# Patient Record
Sex: Female | Born: 1980 | Race: Asian | Hispanic: No | Marital: Single | Smoking: Never smoker
Health system: Southern US, Community
[De-identification: ages and names within clinical notes are randomized; demographics above are authoritative.]

## PROBLEM LIST (undated history)

## (undated) HISTORY — PX: ANTERIOR COMPARTMENT DECOMPRESSION: SHX547

## (undated) HISTORY — PX: ANTERIOR CRUCIATE LIGAMENT REPAIR: SHX115

---

## 2001-08-29 ENCOUNTER — Emergency Department (HOSPITAL_COMMUNITY): Admission: EM | Admit: 2001-08-29 | Discharge: 2001-08-29 | Payer: Self-pay | Admitting: Emergency Medicine

## 2010-09-03 ENCOUNTER — Emergency Department (HOSPITAL_BASED_OUTPATIENT_CLINIC_OR_DEPARTMENT_OTHER)
Admission: EM | Admit: 2010-09-03 | Discharge: 2010-09-03 | Disposition: A | Discharge status: Home / Self Care | Payer: BC Managed Care – PPO | Attending: Emergency Medicine | Admitting: Emergency Medicine

## 2010-09-03 ENCOUNTER — Emergency Department (INDEPENDENT_AMBULATORY_CARE_PROVIDER_SITE_OTHER): Payer: BC Managed Care – PPO

## 2010-09-03 DIAGNOSIS — R209 Unspecified disturbances of skin sensation: Secondary | ICD-10-CM

## 2010-09-03 DIAGNOSIS — R55 Syncope and collapse: Secondary | ICD-10-CM | POA: Insufficient documentation

## 2010-09-03 LAB — URINALYSIS, ROUTINE W REFLEX MICROSCOPIC
Glucose, UA: NEGATIVE mg/dL
Ketones, ur: NEGATIVE mg/dL
Specific Gravity, Urine: 1.004 — ABNORMAL LOW (ref 1.005–1.030)
pH: 6.5 (ref 5.0–8.0)

## 2010-09-03 LAB — DIFFERENTIAL
Basophils Relative: 0 % (ref 0–1)
Lymphs Abs: 1.4 10*3/uL (ref 0.7–4.0)
Monocytes Relative: 8 % (ref 3–12)
Neutro Abs: 4.1 10*3/uL (ref 1.7–7.7)
Neutrophils Relative %: 67 % (ref 43–77)

## 2010-09-03 LAB — CBC
Hemoglobin: 12.6 g/dL (ref 12.0–15.0)
MCH: 26.3 pg (ref 26.0–34.0)
MCV: 77.9 fL — ABNORMAL LOW (ref 78.0–100.0)
RBC: 4.79 MIL/uL (ref 3.87–5.11)

## 2010-09-03 LAB — COMPREHENSIVE METABOLIC PANEL
ALT: <6 U/L (ref 0–35)
AST: 21 U/L (ref 0–37)
Albumin: 4.5 g/dL (ref 3.5–5.2)
Alkaline Phosphatase: 64 U/L (ref 39–117)
GFR calc Af Amer: >60 mL/min (ref >60)
Glucose, Bld: 88 mg/dL (ref 70–99)
Potassium: 3.8 mEq/L (ref 3.5–5.1)
Sodium: 145 mEq/L (ref 135–145)
Total Protein: 8.1 g/dL (ref 6.0–8.3)

## 2011-07-18 ENCOUNTER — Encounter (HOSPITAL_BASED_OUTPATIENT_CLINIC_OR_DEPARTMENT_OTHER): Payer: Self-pay

## 2011-07-18 ENCOUNTER — Emergency Department (HOSPITAL_BASED_OUTPATIENT_CLINIC_OR_DEPARTMENT_OTHER)
Admission: EM | Admit: 2011-07-18 | Discharge: 2011-07-18 | Disposition: A | Discharge status: Home / Self Care | Payer: BC Managed Care – PPO | Attending: Emergency Medicine | Admitting: Emergency Medicine

## 2011-07-18 DIAGNOSIS — R55 Syncope and collapse: Secondary | ICD-10-CM | POA: Insufficient documentation

## 2011-07-18 DIAGNOSIS — R42 Dizziness and giddiness: Secondary | ICD-10-CM | POA: Insufficient documentation

## 2011-07-18 LAB — CBC
MCH: 26.9 pg (ref 26.0–34.0)
MCHC: 33.8 g/dL (ref 30.0–36.0)
MCV: 79.7 fL (ref 78.0–100.0)
Platelets: 206 10*3/uL (ref 150–400)
RDW: 12.4 % (ref 11.5–15.5)

## 2011-07-18 LAB — PREGNANCY, URINE: Preg Test, Ur: NEGATIVE

## 2011-07-18 LAB — BASIC METABOLIC PANEL
BUN: 13 mg/dL (ref 6–23)
Calcium: 8.8 mg/dL (ref 8.4–10.5)
Creatinine, Ser: 1 mg/dL (ref 0.50–1.10)
GFR calc non Af Amer: 75 mL/min — ABNORMAL LOW (ref >90)
Glucose, Bld: 124 mg/dL — ABNORMAL HIGH (ref 70–99)
Sodium: 136 mEq/L (ref 135–145)

## 2011-07-18 NOTE — Discharge Instructions (Signed)
Rest. Drink plenty of fluids. Follow up with primary care doctor in the next few days. Return to ER if worse, faint, persistent vomiting, high fevers, trouble breathing, other concern.     Near-Syncope Near-syncope is sudden weakness, dizziness, or feeling like you might pass out (faint). This may occur when getting up after sitting or while standing for a long period of time. Near-syncope can be caused by a drop in blood pressure. This is a common reaction, but it may occur to a greater degree in people taking medicines to control their blood pressure. Fainting often occurs when the blood pressure or pulse is too low to provide enough blood flow to the brain to keep you conscious. Fainting and near-syncope are not usually due to serious medical problems. However, certain people should be more cautious in the event of near-syncope, including elderly patients, patients with diabetes, and patients with a history of heart conditions (especially irregular rhythms).  CAUSES   Drop in blood pressure.   Physical pain.   Dehydration.   Heat exhaustion.   Emotional distress.   Low blood sugar.   Internal bleeding.   Heart and circulatory problems.   Infections.  SYMPTOMS   Dizziness.   Feeling sick to your stomach (nauseous).   Nearly fainting.   Body numbness.   Turning pale.   Tunnel vision.   Weakness.  HOME CARE INSTRUCTIONS   Lie down right away if you start feeling like you might faint. Breathe deeply and steadily. Wait until all the symptoms have passed. Most of these episodes last only a few minutes. You may feel tired for several hours.   Drink enough fluids to keep your urine clear or pale yellow.   If you are taking blood pressure or heart medicine, get up slowly, taking several minutes to sit and then stand. This can reduce dizziness that is caused by a drop in blood pressure.  SEEK IMMEDIATE MEDICAL CARE IF:   You have a severe headache.   Unusual pain develops  in the chest, abdomen, or back.   There is bleeding from the mouth or rectum, or you have black or tarry stool.   An irregular heartbeat or a very rapid pulse develops.   You have repeated fainting or seizure-like jerking during an episode.   You faint when sitting or lying down.   You develop confusion.   You have difficulty walking.   Severe weakness develops.   Vision problems develop.  MAKE SURE YOU:   Understand these instructions.   Will watch your condition.   Will get help right away if you are not doing well or get worse.  Document Released: 05/04/2005 Document Revised: 01/14/2011 Document Reviewed: 06/20/2010 [facility] Patient Information 2012 [place], [place].    Syncope You have had a fainting (syncopal) spell. A fainting episode is a sudden, short-lived loss of consciousness. It results in complete recovery. It occurs because there has been a temporary shortage of oxygen and/or sugar (glucose) to the brain. CAUSES   Blood pressure pills and other medications that may lower blood pressure below normal. Sudden changes in posture (sudden standing).   Over-medication. Take your medications as directed.   Standing too long. This can cause blood to pool in the legs.   Seizure disorders.   Low blood sugar (hypoglycemia) of diabetes. This more commonly causes coma.   Bearing down to go to the bathroom. This can cause your blood pressure to rise suddenly. Your body compensates by making the blood pressure too  low when you stop bearing down.   Hardening of the arteries where the brain temporarily does not receive enough blood.   Irregular heart beat and circulatory problems.   Fear, emotional distress, injury, sight of blood, or illness.  Your caregiver will send you home if the syncope was from non-worrisome causes (benign). Depending on your age and health, you may stay to be monitored and observed. If you return home, have someone stay with you if your caregiver  feels that is desirable. It is very important to keep all follow-up referrals and appointments in order to properly manage this condition. This is a serious problem which can lead to serious illness and death if not carefully managed.  WARNING: Do not drive or operate machinery until your caregiver feels that it is safe for you to do so. SEEK IMMEDIATE MEDICAL CARE IF:   You have another fainting episode or faint while lying or sitting down. DO NOT DRIVE YOURSELF. Call 911 if no other help is available.   You have chest pain, are feeling sick to your stomach (nausea), vomiting or abdominal pain.   You have an irregular heartbeat or one that is very fast (pulse over 120 beats per minute).   You have a loss of feeling in some part of your body or lose movement in your arms or legs.   You have difficulty with speech, confusion, severe weakness, or visual problems.   You become sweaty and/or feel light headed.  Make sure you are rechecked as instructed. Document Released: 05/04/2005 Document Revised: 01/14/2011 Document Reviewed: 12/23/2006 [facility] Patient Information 2012 [place], [place].

## 2011-07-18 NOTE — ED Notes (Signed)
Pt sts,felt faint and passed out per family member about 30sec. Not eating but going to BR today ok.

## 2011-07-18 NOTE — ED Provider Notes (Signed)
History     CSN: [id number]  Arrival date & time 07/18/11  4098   First MD Initiated Contact with Patient 07/18/11 [phone]      Chief Complaint  Patient presents with  . Loss of Consciousness    (Consider location/radiation/quality/duration/timing/severity/associated sxs/prior treatment) Patient is a 31 y.o. female presenting with syncope. The history is provided by the patient.  Loss of Consciousness Pertinent negatives include no chest pain, no abdominal pain, no headaches and no shortness of breath.  pt presents from home after ?near syncopal vs transiently syncopal episode. Pt states recent rx w abx for cough, states cough is better, but says in past 1-2 days the medication occasionally makes nauseated, as a result, poor po intake for past couple days. Tonight got up to go to bathroom, felt lightheaded/sl nauseated, got warm/flushed feeling, stood up, felt faint, leaned against bed/dresser, ?transient loc. No seizure activity or postictal period. Denies palpitations or irregular heartbeat. No cp or sob. Says remote hx similar symptoms, no hx dysrhythmia. No blood loss, heavy vaginal bleeding or any rectal bleeding. No fever or chills. Denies pain. No headache. No neck pain or stiffness. No abd pain. No gu c/o.   History reviewed. No pertinent past medical history.  Past Surgical History  Procedure Date  . Anterior cruciate ligament repair   . Anterior compartment decompression     No family history on file.  History  Substance Use Topics  . Smoking status: Not on file  . Smokeless tobacco: Not on file  . Alcohol Use: No    OB History    Grav Para Term Preterm Abortions TAB SAB Ect Mult Living                  Review of Systems  Constitutional: Negative for fever and chills.  HENT: Negative for neck pain.   Eyes: Negative for visual disturbance.  Respiratory: Negative for shortness of breath.   Cardiovascular: Positive for syncope. Negative for chest pain and  palpitations.  Gastrointestinal: Negative for abdominal pain and diarrhea.  Genitourinary: Negative for dysuria and flank pain.  Musculoskeletal: Negative for back pain.  Skin: Negative for rash.  Neurological: Negative for weakness, numbness and headaches.  Hematological: Does not bruise/bleed easily.  Psychiatric/Behavioral: Negative for confusion.    Allergies  Review of patient's allergies indicates no known allergies.  Home Medications   Current Outpatient Rx  Name Route Sig Dispense Refill  . BENZONATATE 200 MG PO CAPS Oral Take 200 mg by mouth 3 (three) times daily as needed.    . SULFAMETHOXAZOLE-TRIMETHOPRIM 200-40 MG/5ML PO SUSP Oral Take by mouth 2 (two) times daily.      BP 110/72  Pulse 86  Temp(Src) 99.6 F (37.6 C) (Oral)  Resp 16  Ht 5\' 1"  (1.549 m)  Wt 158 lb (71.668 kg)  BMI 29.85 kg/m2  SpO2 99%  LMP 07/18/2011  Physical Exam  Nursing note and vitals reviewed. Constitutional: She is oriented to person, place, and time. She appears well-developed and well-nourished. No distress.  HENT:  Head: Atraumatic.  Mouth/Throat: Oropharynx is clear and moist.       tms wnl  Eyes: Conjunctivae are normal. Pupils are equal, round, and reactive to light. No scleral icterus.  Neck: Normal range of motion. Neck supple. No tracheal deviation present.  Cardiovascular: Normal rate, regular rhythm, normal heart sounds and intact distal pulses.  Exam reveals no gallop and no friction rub.   No murmur heard. Pulmonary/Chest: Effort normal and breath sounds  normal. No respiratory distress.  Abdominal: Soft. Normal appearance and bowel sounds are normal. She exhibits no distension and no mass. There is no tenderness. There is no rebound and no guarding.  Genitourinary:       No cva tenderness  Musculoskeletal: She exhibits no edema.  Neurological: She is alert and oriented to person, place, and time.       Motor intact bil, steady gait.   Skin: Skin is warm and dry. No  rash noted.  Psychiatric:       Anxious/shaky.     ED Course  Procedures (including critical care time)   Labs Reviewed  BASIC METABOLIC PANEL  CBC  PREGNANCY, URINE   Results for orders placed during the hospital encounter of 07/18/11  BASIC METABOLIC PANEL      Component Value Range   Sodium 136  135 - 145 (mEq/L)   Potassium 3.7  3.5 - 5.1 (mEq/L)   Chloride 99  96 - 112 (mEq/L)   CO2 25  19 - 32 (mEq/L)   Glucose, Bld 124 (*) 70 - 99 (mg/dL)   BUN 13  6 - 23 (mg/dL)   Creatinine, Ser 1.61  0.50 - 1.10 (mg/dL)   Calcium 8.8  8.4 - 09.6 (mg/dL)   GFR calc non Af Amer 75 (*) >90 (mL/min)   GFR calc Af Amer 87 (*) >90 (mL/min)  CBC      Component Value Range   WBC 3.2 (*) 4.0 - 10.5 (K/uL)   RBC 4.72  3.87 - 5.11 (MIL/uL)   Hemoglobin 12.7  12.0 - 15.0 (g/dL)   HCT 04.5  40.9 - 81.1 (%)   MCV 79.7  78.0 - 100.0 (fL)   MCH 26.9  26.0 - 34.0 (pg)   MCHC 33.8  30.0 - 36.0 (g/dL)   RDW 91.4  78.2 - 95.6 (%)   Platelets 206  150 - 400 (K/uL)  PREGNANCY, URINE      Component Value Range   Preg Test, Ur NEGATIVE  NEGATIVE       MDM  Po fluids. Labs.   hgb at baseline, u preg neg. Taking po. No faintness or dizziness. ?mild vol dep, ?vagal episode w lightheadedness post using bathroom. Pt stable for d/c home.       Suzi Roots, MD 07/18/11 [phone]

## 2012-06-25 IMAGING — CR DG CHEST 2V
2 series · 2 of 2 positions shown · non-contrast
Comparison: None

CLINICAL DATA: Numbness and tingling.  Racing heart

CHEST - 2 VIEW

[w chest pa]
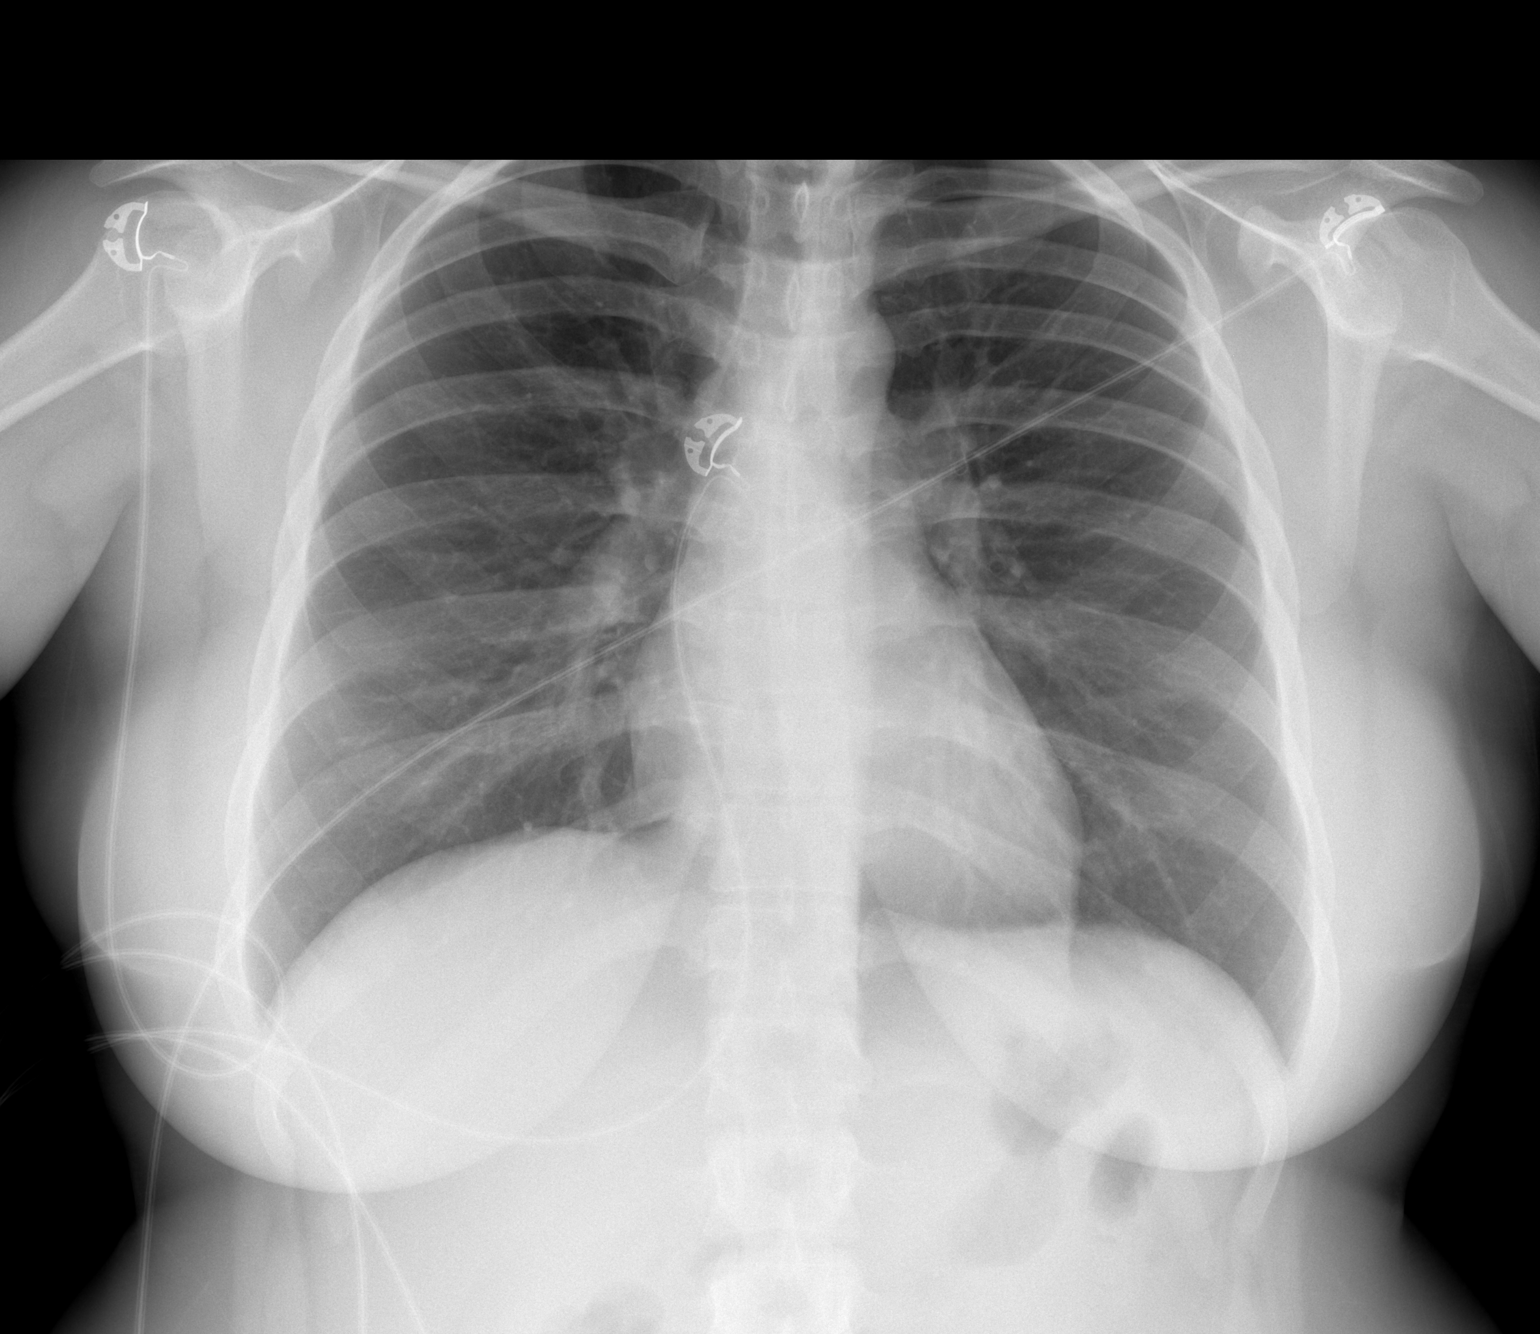

[w chest lat]
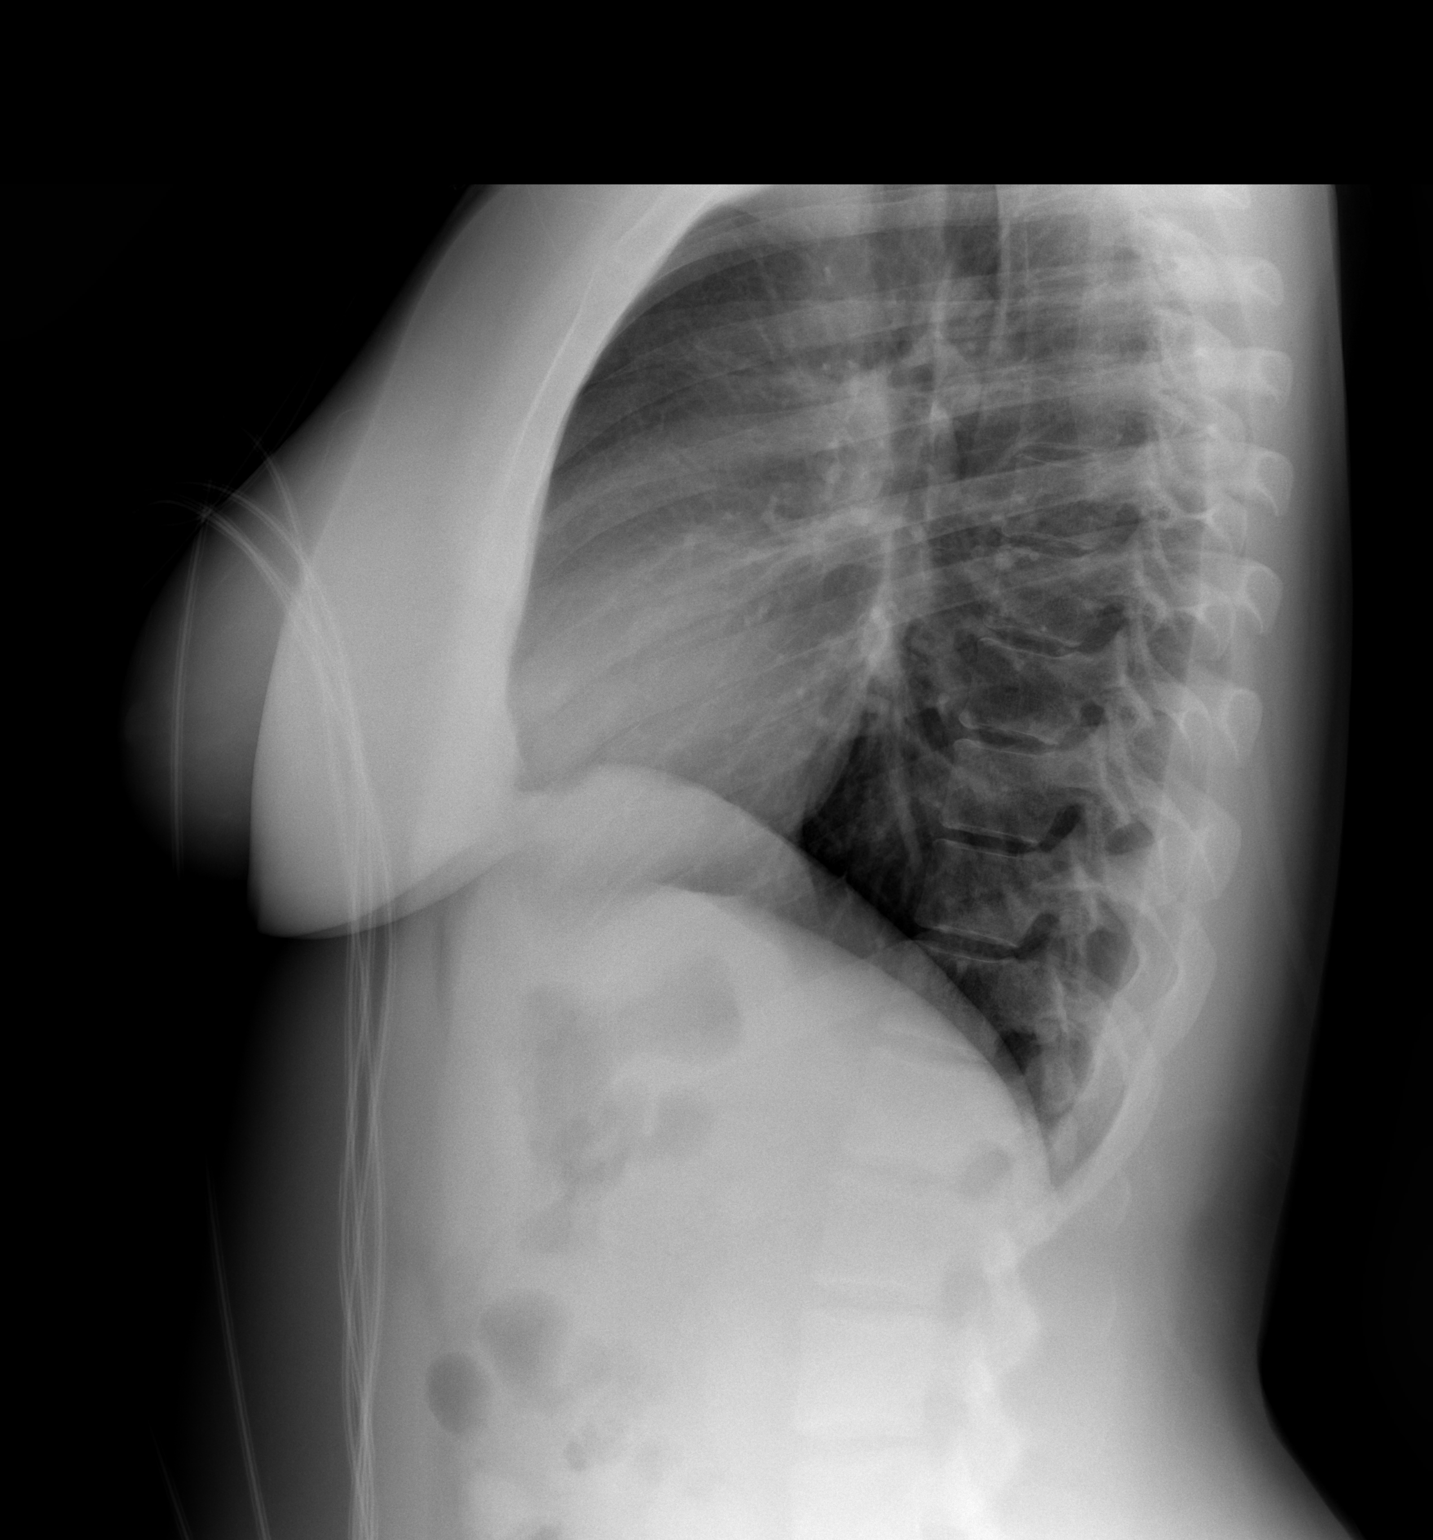

[2 of 2 positions shown; findings below may reference images not displayed]

FINDINGS: Heart size is normal.  Vascularity normal.  Lungs are
clear without infiltrate or mass.
IMPRESSION: Negative

## 2013-03-12 ENCOUNTER — Emergency Department (HOSPITAL_BASED_OUTPATIENT_CLINIC_OR_DEPARTMENT_OTHER)
Admission: EM | Admit: 2013-03-12 | Discharge: 2013-03-12 | Disposition: A | Discharge status: Home / Self Care | Payer: BC Managed Care – PPO | Attending: Emergency Medicine | Admitting: Emergency Medicine

## 2013-03-12 ENCOUNTER — Encounter (HOSPITAL_BASED_OUTPATIENT_CLINIC_OR_DEPARTMENT_OTHER): Payer: Self-pay | Admitting: Emergency Medicine

## 2013-03-12 DIAGNOSIS — S058X9A Other injuries of unspecified eye and orbit, initial encounter: Secondary | ICD-10-CM | POA: Insufficient documentation

## 2013-03-12 DIAGNOSIS — H2100 Hyphema, unspecified eye: Secondary | ICD-10-CM | POA: Insufficient documentation

## 2013-03-12 DIAGNOSIS — H2102 Hyphema, left eye: Secondary | ICD-10-CM

## 2013-03-12 DIAGNOSIS — S0502XA Injury of conjunctiva and corneal abrasion without foreign body, left eye, initial encounter: Secondary | ICD-10-CM

## 2013-03-12 DIAGNOSIS — Z792 Long term (current) use of antibiotics: Secondary | ICD-10-CM | POA: Insufficient documentation

## 2013-03-12 DIAGNOSIS — W219XXA Striking against or struck by unspecified sports equipment, initial encounter: Secondary | ICD-10-CM | POA: Insufficient documentation

## 2013-03-12 DIAGNOSIS — Y9239 Other specified sports and athletic area as the place of occurrence of the external cause: Secondary | ICD-10-CM | POA: Insufficient documentation

## 2013-03-12 DIAGNOSIS — Y9366 Activity, soccer: Secondary | ICD-10-CM | POA: Insufficient documentation

## 2013-03-12 MED ORDER — TETRACAINE HCL 0.5 % OP SOLN
OPHTHALMIC | Status: AC
  Administered 2013-03-12: 20:00:00
  Filled 2013-03-12: qty 2

## 2013-03-12 MED ORDER — HYDROCODONE-ACETAMINOPHEN 5-325 MG PO TABS: 2.0000 | ORAL_TABLET | Freq: Four times a day (QID) | ORAL | Status: DC | PRN

## 2013-03-12 MED ORDER — PREDNISOLONE ACETATE 1 % OP SUSP: 2.0000 [drp] | Freq: Three times a day (TID) | OPHTHALMIC | Status: DC

## 2013-03-12 MED ORDER — FLUORESCEIN SODIUM 1 MG OP STRP
ORAL_STRIP | OPHTHALMIC | Status: AC
  Administered 2013-03-12: 20:00:00
  Filled 2013-03-12: qty 1

## 2013-03-12 MED ORDER — CYCLOPENTOLATE HCL 1 % OP SOLN
2.0000 [drp] | Freq: Once | OPHTHALMIC | Status: AC
  Administered 2013-03-12: 2 [drp] via OPHTHALMIC
  Filled 2013-03-12: qty 2

## 2013-03-12 NOTE — ED Notes (Addendum)
Hit in face with soccer ball eye area, abrasion noted under left eye, unable to see out of left eye, sees shadows and lights, c/o nausea, no headache

## 2013-03-12 NOTE — ED Provider Notes (Signed)
CSN: [id number]     Arrival date & time 03/12/13  1929 History  This chart was scribed for Charles B. Bernette Mayers, MD by Leone Payor, ED Scribe. This patient was seen in room MH01/MH01 and the patient's care was started 7:58 PM.    Chief Complaint  Patient presents with  . Head Injury    The history is provided by the patient. No language interpreter was used.    HPI Comments: Elizabeth Valentine is a 32 y.o. female who presents to the Emergency Department complaining of a facial injury that occurred PTA. Pt states she was playing soccer when she was struck to the left face with a soccer ball. Pt states she has constant, unchanged left eye pain that is described as burning. She states she can see but her vision is very blurry and has a yellow haze. Pt does not wear contacts or glasses. She denies HA.    History reviewed. No pertinent past medical history. Past Surgical History  Procedure Laterality Date  . Anterior cruciate ligament repair    . Anterior compartment decompression     History reviewed. No pertinent family history. History  Substance Use Topics  . Smoking status: Never Smoker   . Smokeless tobacco: Not on file  . Alcohol Use: No   OB History   Grav Para Term Preterm Abortions TAB SAB Ect Mult Living                 Review of Systems A complete 10 system review of systems was obtained and all systems are negative except as noted in the HPI and PMH.   Allergies  Review of patient's allergies indicates no known allergies.  Home Medications   Current Outpatient Rx  Name  Route  Sig  Dispense  Refill  . benzonatate (TESSALON) 200 MG capsule   Oral   Take 200 mg by mouth 3 (three) times daily as needed.         . sulfamethoxazole-trimethoprim (BACTRIM,SEPTRA) 200-40 MG/5ML suspension   Oral   Take by mouth 2 (two) times daily.          BP 121/65  Pulse 84  Temp(Src) 98.4 F (36.9 C) (Oral)  Resp 18  Ht 5\' 2"  (1.575 m)  Wt 153 lb (69.4 kg)  BMI 27.98 kg/m2   SpO2 98% Physical Exam  Constitutional: She is oriented to person, place, and time. She appears well-developed and well-nourished.  HENT:  Head: Normocephalic.  Minimal soft tissue swelling L periorbital area without bony tenderness or laceration  Eyes: EOM are normal. Pupils are equal, round, and reactive to light.  There is a small corneal abrasion at 12 o'clock, Grade 1 hyphema seen on slit lamp, IOP 16 on the L and 12 on the right. No concern for globe rupture  Neck: Neck supple.  Pulmonary/Chest: Effort normal.  Neurological: She is alert and oriented to person, place, and time. No cranial nerve deficit.  Psychiatric: She has a normal mood and affect. Her behavior is normal.    ED Course  Procedures  DIAGNOSTIC STUDIES: Oxygen Saturation is 98% on RA, normal by my interpretation.    COORDINATION OF CARE: 7:58 PM Discussed treatment plan with pt at bedside and pt agreed to plan.   Labs Review Labs Reviewed - No data to display Imaging Review No results found.  EKG Interpretation   None       MDM   1. Corneal abrasion, left, initial encounter   2. Hyphema of left  eye     Corneal abrasion and hyphema on exam. Pain and blurry vision improved with tetracaine. No concern for orbital fracture. Discussed with Dr. Karleen Hampshire who recommends cyclogyl and pred forte drops. He will see the patient in his office tomorrow. Pt given bottle of Cyclogyl and Rx for Pred-Forte. Pain well controlled for now.   I personally performed the services described in this documentation, which was scribed in my presence. The recorded information has been reviewed and is accurate.      Charles B. Bernette Mayers, MD 03/12/13 2059

## 2013-03-12 NOTE — ED Notes (Signed)
MD at bedside. 

## 2013-03-12 NOTE — ED Notes (Signed)
EDP Bernette Mayers notified of pt status

## 2014-07-03 ENCOUNTER — Encounter (HOSPITAL_BASED_OUTPATIENT_CLINIC_OR_DEPARTMENT_OTHER): Payer: Self-pay | Admitting: Emergency Medicine

## 2014-07-03 ENCOUNTER — Emergency Department (HOSPITAL_BASED_OUTPATIENT_CLINIC_OR_DEPARTMENT_OTHER)
Admission: EM | Admit: 2014-07-03 | Discharge: 2014-07-03 | Disposition: A | Discharge status: Home / Self Care | Payer: BLUE CROSS/BLUE SHIELD | Attending: Emergency Medicine | Admitting: Emergency Medicine

## 2014-07-03 DIAGNOSIS — R002 Palpitations: Secondary | ICD-10-CM | POA: Diagnosis not present

## 2014-07-03 DIAGNOSIS — R202 Paresthesia of skin: Secondary | ICD-10-CM | POA: Diagnosis not present

## 2014-07-03 DIAGNOSIS — R61 Generalized hyperhidrosis: Secondary | ICD-10-CM | POA: Diagnosis not present

## 2014-07-03 DIAGNOSIS — R42 Dizziness and giddiness: Secondary | ICD-10-CM | POA: Diagnosis not present

## 2014-07-03 DIAGNOSIS — R064 Hyperventilation: Secondary | ICD-10-CM | POA: Insufficient documentation

## 2014-07-03 DIAGNOSIS — R682 Dry mouth, unspecified: Secondary | ICD-10-CM | POA: Insufficient documentation

## 2014-07-03 NOTE — ED Notes (Signed)
MD at bedside. 

## 2014-07-03 NOTE — ED Provider Notes (Signed)
CSN: [id number]     Arrival date & time 07/03/14  0007 History  This chart was scribed for Hanley SeamenJohn L Lyndal Reggio, MD by Abel PrestoKara Demonbreun, ED Scribe. This patient was seen in room MH02/MH02 and the patient's care was started at 12:24 AM.     Chief Complaint  Patient presents with  . Palpitations    The history is provided by the patient. No language interpreter was used.   Patient is a 34 y.o. female presenting with palpitations.  Palpitations   HPI Comments: Elizabeth Smilesrina Michalik is a 34 y.o. female who presents to the Emergency Department complaining of palpitations with onset just PTA. Pt notes she was awoken from sleep at onset. Pt notes associated dry mouth, diaphoresis with clammy hands and mild tingling in fingers. Pt notes symptoms happened twice, lasted approximately 1 minute each episode, and resolved on their own. Patient denies shortness of breath, but nurse states pt was hyperventilating on arrival. The patient has had similar symptoms in the past. She has also had some episodic vertigo. Pt saw her PCP about 2 weeks ago. Pt states blood work was done at that time with no significant findings. Pt took her blood sugar PTA; it was 92. Pt denies tingling around mouth, diarrhea, unplanned weight loss, intolerance to heat and vomiting. Pt's PCP is Dr[facility] Dimple Caseyice at [facility].  History reviewed. No pertinent past medical history. Past Surgical History  Procedure Laterality Date  . Anterior cruciate ligament repair    . Anterior compartment decompression     No family history on file. History  Substance Use Topics  . Smoking status: Never Smoker   . Smokeless tobacco: Not on file  . Alcohol Use: No   OB History    No data available     Review of Systems  Cardiovascular: Positive for palpitations.  A complete 10 system review of systems was obtained and all systems are negative except as noted in the HPI and PMH.     Allergies  Review of patient's allergies indicates no known  allergies.  Home Medications   Prior to Admission medications   Not on File   BP 134/73 mmHg  Pulse 86  Resp 20  Ht 5\' 2"  (1.575 m)  Wt 159 lb (72.122 kg)  BMI 29.07 kg/m2  SpO2 100%   Physical Exam  General: Well-developed, well-nourished female in no acute distress; appearance consistent with age of record HENT: normocephalic; atraumatic; no thyromegaly Eyes: pupils equal, round and reactive to light; extraocular muscles intact Neck: supple Heart: regular rate and rhythm; no murmurs, rubs or gallops; normal sinus on monitor, no ectopy Lungs: clear to auscultation bilaterally Abdomen: soft; nondistended; nontender; no masses or hepatosplenomegaly; bowel sounds present Extremities: No deformity; full range of motion; pulses normal Neurologic: Awake, alert and oriented; motor function intact in all extremities and symmetric; no facial droop Skin: Warm and dry Psychiatric: Anxious   ED Course  Procedures (including critical care time) DIAGNOSTIC STUDIES: Oxygen Saturation is 100% on room air, normal by my interpretation.    COORDINATION OF CARE: 12:33 AM Discussed treatment plan with patient at beside, the patient agrees with the plan and has no further questions at this time.    EKG Interpretation   Date/Time:  Tuesday July 03 2014 00:18:40 EST Ventricular Rate:  87 PR Interval:  146 QRS Duration: 84 QT Interval:  392 QTC Calculation: 471 R Axis:   79 Text Interpretation:  Normal sinus rhythm Normal ECG Rate is faster  Confirmed  by Read Drivers  MD, Jonny Ruiz [id number]) on 07/03/2014 12:19:45 AM      MDM  1:16 AM Patient observed for over an hour without dysrhythmia or ectopy seen. We will refer her back to her primary care physician. She was advised that she may need to wear a heart monitor if symptoms persist. She was also advised to have her thyroid functions checked.  I personally performed the services described in this documentation, which was scribed in my presence.  The recorded information has been reviewed and is accurate.    Hanley Seamen, MD 07/03/14 [phone]

## 2014-07-03 NOTE — ED Notes (Signed)
palpatations 

## 2016-09-08 ENCOUNTER — Encounter: Payer: Self-pay | Admitting: *Deleted

## 2016-09-08 ENCOUNTER — Emergency Department
Admission: EM | Admit: 2016-09-08 | Discharge: 2016-09-08 | Disposition: A | Discharge status: Home / Self Care | Payer: BLUE CROSS/BLUE SHIELD | Source: Home / Self Care | Attending: Family Medicine | Admitting: Family Medicine

## 2016-09-08 DIAGNOSIS — J029 Acute pharyngitis, unspecified: Secondary | ICD-10-CM

## 2016-09-08 LAB — POCT RAPID STREP A (OFFICE): RAPID STREP A SCREEN: NEGATIVE

## 2016-09-08 MED ORDER — AZITHROMYCIN 250 MG PO TABS: ORAL_TABLET | ORAL | 0 refills | Status: AC

## 2016-09-08 NOTE — ED Triage Notes (Signed)
Pt c/o fever up to 101.5 and sore throat x 0330 today. She took Advil at 0830. Denies cough.

## 2016-09-08 NOTE — ED Provider Notes (Signed)
[facility]    CSN: [id number] Arrival date & time: 09/08/16  8295     History   Chief Complaint Chief Complaint  Patient presents with  . Sore Throat  . Fever    HPI Elizabeth Valentine is a 36 y.o. female.   Patient became fatigued yesterday evening followed by development of a sore throat.  After midnight she developed fever to 101.5 that persisted this morning.  She remains fatigued.  She has a slight increase in difficulty equalizing pressure in her ears, but denies increased nasal congestion and cough.  She has a history of seasonal rhinitis.   The history is provided by the patient.    History reviewed. No pertinent past medical history.  There are no active problems to display for this patient.   Past Surgical History:  Procedure Laterality Date  . ANTERIOR COMPARTMENT DECOMPRESSION    . ANTERIOR CRUCIATE LIGAMENT REPAIR      OB History    No data available       Home Medications    Prior to Admission medications   Medication Sig Start Date End Date Taking? Authorizing Provider  fluticasone (FLONASE) 50 MCG/ACT nasal spray Place into both nostrils daily.   Yes Historical Provider, MD  azithromycin (ZITHROMAX Z-PAK) 250 MG tablet Take 2 tabs today; then begin one tab once daily for 4 more days. (Rx void after 09/16/16) 09/08/16   Lattie Haw, MD    Family History History reviewed. No pertinent family history.  Social History Social History  Substance Use Topics  . Smoking status: Never Smoker  . Smokeless tobacco: Never Used  . Alcohol use No     Allergies   Patient has no known allergies.   Review of Systems Review of Systems + sore throat No cough No pleuritic pain No wheezing ? nasal congestion + post-nasal drainage No sinus pain/pressure No itchy/red eyes No earache No hemoptysis No SOB + fever, + chills No nausea No vomiting No abdominal pain No diarrhea No urinary symptoms No skin rash + fatigue ? myalgias No  headache Used OTC meds without relief   Physical Exam Triage Vital Signs ED Triage Vitals  Enc Vitals Group     BP 09/08/16 1028 111/73     Pulse Rate 09/08/16 1028 71     Resp 09/08/16 1028 16     Temp 09/08/16 1028 98.6 F (37 C)     Temp Source 09/08/16 1028 Oral     SpO2 09/08/16 1028 97 %     Weight 09/08/16 1029 170 lb (77.1 kg)     Height 09/08/16 1029  (1.549 m)     Head Circumference --      Peak Flow --      Pain Score 09/08/16 1029 0     Pain Loc --      Pain Edu? --      Excl. in GC? --    No data found.   Updated Vital Signs BP 111/73 (BP Location: Left Arm)   Pulse 71   Temp 98.6 F (37 C) (Oral)   Resp 16   Ht  (1.549 m)   Wt 170 lb (77.1 kg)   LMP 08/25/2016   SpO2 97%   BMI 32.12 kg/m   Visual Acuity Right Eye Distance:   Left Eye Distance:   Bilateral Distance:    Right Eye Near:   Left Eye Near:    Bilateral Near:     Physical Exam  Nursing notes and Vital Signs reviewed. Appearance:  Patient appears stated age, and in no acute distress Eyes:  Pupils are equal, round, and reactive to light and accomodation.  Extraocular movement is intact.  Conjunctivae are not inflamed  Ears:  Canals normal.  Tympanic membranes normal.  Nose:  Mildly congested turbinates.  No sinus tenderness.    Pharynx:  Minimal erythema Neck:  Supple.  Tender enlarged posterior/lateral nodes are palpated bilaterally  Lungs:  Clear to auscultation.  Breath sounds are equal.  Moving air well. Heart:  Regular rate and rhythm without murmurs, rubs, or gallops.  Abdomen:  Nontender without masses or hepatosplenomegaly.  Bowel sounds are present.  No CVA or flank tenderness.  Extremities:  No edema.  Skin:  No rash present.    UC Treatments / Results  Labs (all labs ordered are listed, but only abnormal results are displayed) Labs Reviewed  POCT RAPID STREP A (OFFICE) negative    EKG  EKG Interpretation None       Radiology No results  found.  Procedures Procedures (including critical care time)  Medications Ordered in UC Medications - No data to display   Initial Impression / Assessment and Plan / UC Course  I have reviewed the triage vital signs and the nursing notes.  Pertinent labs & imaging results that were available during my care of the patient were reviewed by me and considered in my medical decision making (see chart for details).    There is no evidence of bacterial infection today.  Suspect early developing viral URI Treat symptomatically for now  Throat culture pending. As cold symptoms develop, try the following: Take plain guaifenesin (  extended release tabs such as Mucinex) twice daily, with plenty of water, for cough and congestion.  May add Pseudoephedrine ( , one or two every 4 to 6 hours) for sinus congestion.  Get adequate rest.   May use Afrin nasal spray (or generic oxymetazoline) each morning for about 5 days and then discontinue.  Also recommend using saline nasal spray several times daily and saline nasal irrigation (AYR is a common brand).  Use Flonase nasal spray each morning after using Afrin nasal spray and saline nasal irrigation. Try warm salt water gargles for sore throat.  Stop all antihistamines for now, and other non-prescription cough/cold preparations. May take Ibuprofen , 4 tabs every 8 hours with food for sore throat, headache, etc. May take Delsym Cough Suppressant at bedtime for nighttime cough.  Begin Azithromycin if not improving about one week or if persistent fever develops (Given a prescription to hold, with an expiration date)  Follow-up with family doctor if not improving about10 days.     Final Clinical Impressions(s) / UC Diagnoses   Final diagnoses:  Acute pharyngitis, unspecified etiology    New Prescriptions New Prescriptions   AZITHROMYCIN (ZITHROMAX Z-PAK) 250 MG TABLET    Take 2 tabs today; then begin one tab once daily for 4 more days. (Rx  void after 09/16/16)     Lattie Haw, MD 09/08/16 1057

## 2016-09-08 NOTE — Discharge Instructions (Signed)
As cold symptoms develop, try the following: Take plain guaifenesin (  extended release tabs such as Mucinex) twice daily, with plenty of water, for cough and congestion.  May add Pseudoephedrine ( , one or two every 4 to 6 hours) for sinus congestion.  Get adequate rest.   May use Afrin nasal spray (or generic oxymetazoline) each morning for about 5 days and then discontinue.  Also recommend using saline nasal spray several times daily and saline nasal irrigation (AYR is a common brand).  Use Flonase nasal spray each morning after using Afrin nasal spray and saline nasal irrigation. Try warm salt water gargles for sore throat.  Stop all antihistamines for now, and other non-prescription cough/cold preparations. May take Ibuprofen , 4 tabs every 8 hours with food for sore throat, headache, etc. May take Delsym Cough Suppressant at bedtime for nighttime cough.  Begin Azithromycin if not improving about one week or if persistent fever develops   Follow-up with family doctor if not improving about10 days.

## 2016-09-09 ENCOUNTER — Telehealth: Payer: Self-pay | Admitting: *Deleted

## 2016-09-09 LAB — STREP A DNA PROBE: GASP: NOT DETECTED

## 2016-09-09 NOTE — Telephone Encounter (Signed)
Attempted to call pt with Tcx results phone number [phone] x [phone].

## 2016-09-10 ENCOUNTER — Telehealth: Payer: Self-pay | Admitting: *Deleted

## 2016-09-10 NOTE — Telephone Encounter (Signed)
Callback: No answer. LMOM f/u from visit. TCX negative.
